# Patient Record
Sex: Female | Born: 1940 | Race: Black or African American | Hispanic: No
Health system: Southern US, Community
[De-identification: ages and names within clinical notes are randomized; demographics above are authoritative.]

## PROBLEM LIST (undated history)

## (undated) DIAGNOSIS — E7800 Pure hypercholesterolemia, unspecified: Secondary | ICD-10-CM

## (undated) DIAGNOSIS — I10 Essential (primary) hypertension: Secondary | ICD-10-CM

## (undated) HISTORY — PX: PARTIAL HYSTERECTOMY: SHX80

---

## 2015-06-04 ENCOUNTER — Emergency Department (HOSPITAL_COMMUNITY)
Admission: EM | Admit: 2015-06-04 | Discharge: 2015-06-04 | Disposition: A | Discharge status: Home / Self Care | Payer: Medicare HMO | Attending: Emergency Medicine | Admitting: Emergency Medicine

## 2015-06-04 ENCOUNTER — Emergency Department (HOSPITAL_COMMUNITY): Payer: Medicare HMO

## 2015-06-04 ENCOUNTER — Encounter (HOSPITAL_COMMUNITY): Payer: Self-pay | Admitting: Emergency Medicine

## 2015-06-04 DIAGNOSIS — I10 Essential (primary) hypertension: Secondary | ICD-10-CM | POA: Diagnosis not present

## 2015-06-04 DIAGNOSIS — M25562 Pain in left knee: Secondary | ICD-10-CM | POA: Diagnosis not present

## 2015-06-04 DIAGNOSIS — Y999 Unspecified external cause status: Secondary | ICD-10-CM | POA: Diagnosis not present

## 2015-06-04 DIAGNOSIS — Y939 Activity, unspecified: Secondary | ICD-10-CM | POA: Diagnosis not present

## 2015-06-04 DIAGNOSIS — M25561 Pain in right knee: Secondary | ICD-10-CM | POA: Diagnosis not present

## 2015-06-04 DIAGNOSIS — Y9222 Religious institution as the place of occurrence of the external cause: Secondary | ICD-10-CM | POA: Insufficient documentation

## 2015-06-04 DIAGNOSIS — M25571 Pain in right ankle and joints of right foot: Secondary | ICD-10-CM | POA: Diagnosis not present

## 2015-06-04 DIAGNOSIS — W010XXA Fall on same level from slipping, tripping and stumbling without subsequent striking against object, initial encounter: Secondary | ICD-10-CM | POA: Insufficient documentation

## 2015-06-04 DIAGNOSIS — M79601 Pain in right arm: Secondary | ICD-10-CM

## 2015-06-04 DIAGNOSIS — M25531 Pain in right wrist: Secondary | ICD-10-CM | POA: Insufficient documentation

## 2015-06-04 DIAGNOSIS — Z79899 Other long term (current) drug therapy: Secondary | ICD-10-CM | POA: Insufficient documentation

## 2015-06-04 HISTORY — DX: Essential (primary) hypertension: I10

## 2015-06-04 HISTORY — DX: Pure hypercholesterolemia, unspecified: E78.00

## 2015-06-04 MED ORDER — ACETAMINOPHEN 325 MG PO TABS
650.0000 mg | ORAL_TABLET | Freq: Once | ORAL | Status: AC
  Administered 2015-06-04: 650 mg via ORAL
  Filled 2015-06-04: qty 2

## 2015-06-04 NOTE — Discharge Instructions (Signed)
You were seen in the emergency room today for evaluation after a fall. Your x-rays showed a possible fracture in your right elbow. To be on the safe side we put you in a splint and gave you a shoulder sling. When you return home please have your primary care provider refer you to an orthopedic specialist for follow up. In the meantime you may take Tylenol as needed for pain. The rest of your x-rays showed evidence of arthritis but no other abnormal findings. Return to the emergency room for new or worsening symptoms.

## 2015-06-04 NOTE — ED Notes (Addendum)
Tripped over a rug at a convention today. Fell onto concrete. Having general right arm pain, no obvious deformities, able to move all extremities in triage. Says she fell directly onto right side.

## 2015-06-04 NOTE — ED Provider Notes (Signed)
CSN: [id number]     Arrival date & time 06/04/15  1106 History   First MD Initiated Contact with Patient 06/04/15 1120     Chief Complaint  Patient presents with  . Hand Injury    HPI  Alisha King is an 75 y.o. female with history of HTN, HLD who presents to the ED for evaluation after a mechanical fall. She states she is visiting the area from [place] and was at [employer] just prior to arrival and tripped over a rug, falling onto her right side. She states her entire right arm and both knees are in pain now. She has not taken anything to alleviate her pain. She denies hitting her head or LOC. Denies dizziness, headache, blurry vision, n/v. Denies chest pain or SOB. She is not on any blood thinners.  Past Medical History  Diagnosis Date  . Hypertension   . High cholesterol    Past Surgical History  Procedure Laterality Date  . Partial hysterectomy     History reviewed. No pertinent family history. Social History  Substance Use Topics  . Smoking status: None  . Smokeless tobacco: None  . Alcohol Use: None   OB History    No data available     Review of Systems  All other systems reviewed and are negative.     Allergies  Review of patient's allergies indicates no known allergies.  Home Medications   Prior to Admission medications   Medication Sig Start Date End Date Taking? Authorizing Provider  atorvastatin (LIPITOR) 20 MG tablet Take 20 mg by mouth daily.   Yes Historical Provider, MD  Multiple Vitamin (MULTIVITAMIN WITH MINERALS) TABS tablet Take 1 tablet by mouth daily.   Yes Historical Provider, MD  Omega-3 Fatty Acids (FISH OIL PO) Take 4 capsules by mouth daily.   Yes Historical Provider, MD  triamterene-hydrochlorothiazide (MAXZIDE-25) 37.5-25 MG tablet Take 1 tablet by mouth daily.   Yes Historical Provider, MD  zinc gluconate 50 MG tablet Take 50 mg by mouth daily.   Yes Historical Provider, MD   BP 168/77 mmHg  Pulse 78  Temp(Src) 98.6 F (37  C) (Oral)  Resp 18  SpO2 97% Physical Exam  Constitutional: She is oriented to person, place, and time.  HENT:  Head: Atraumatic.  Right Ear: External ear normal.  Left Ear: External ear normal.  Nose: Nose normal.  Mouth/Throat: Oropharynx is clear and moist. No oropharyngeal exudate.  No facial tenderness or crepitus. No deformity. No scalp edema or laceration  Eyes: Conjunctivae and EOM are normal. Pupils are equal, round, and reactive to light.  Neck: Normal range of motion. Neck supple.  No c-spine tenderness  Cardiovascular: Normal rate, regular rhythm, normal heart sounds and intact distal pulses.   Pulmonary/Chest: Effort normal and breath sounds normal. No respiratory distress. She has no wheezes. She exhibits no tenderness.  Abdominal: Soft. Bowel sounds are normal. She exhibits no distension. There is no tenderness. There is no rebound and no guarding.  Musculoskeletal: She exhibits no edema.  No t-spine or l-spine tenderness. No stepoff or deformity. No right or left arm or elbow tenderness.  +R wrist tenderness but no deformity, edema, or discoloration. FROM of wrist. No hand tenderness. No snuffbox tenderness. FROM of fingers, normal finger thumb opposition. 2+ radial pulses, brisk cap refill Bilateral knees nontender. No edema. FROM.  +R ankle tenderness at medial AND lateral malleoli. FROM. No deformity or discoloration Ambulatory with steady gait  Neurological: She is alert and  oriented to person, place, and time. No cranial nerve deficit.  Normal finger to nose No pronator drift  Skin: Skin is warm and dry.  Psychiatric: She has a normal mood and affect.  Nursing note and vitals reviewed.   ED Course  Procedures (including critical care time) Labs Review Labs Reviewed - No data to display  Imaging Review Dg Lumbar Spine Complete  06/04/2015  CLINICAL DATA:  Acute low back pain after fall at church today. EXAM: LUMBAR SPINE - COMPLETE 4+ VIEW COMPARISON:   None. FINDINGS: No fracture or spondylolisthesis is noted. Mild degenerative disc disease is noted at L1-2, L2-3 and L3-4 with anterior osteophyte formation. Atherosclerosis of abdominal aorta is noted. Degenerative changes seen involving posterior facet joints of L4-5 and L5-S1. IMPRESSION: Multilevel degenerative disc disease. No acute abnormality seen in the lumbar spine. Electronically Signed   By: Lupita Raider, M.D.   On: 06/04/2015 12:42   Dg Shoulder Right  06/04/2015  CLINICAL DATA:  Pain following fall EXAM: RIGHT SHOULDER - 2+ VIEW COMPARISON:  None. FINDINGS: Frontal, Y scapular, and axillary images obtained. There is no acute fracture or dislocation. There is moderate generalized osteoarthritic change. There is moderate bony overgrowth along the inferior right distal clavicle. No erosive change. Visualized right lung is clear. IMPRESSION: Moderate generalized osteoarthritic change. Note that there is bony overgrowth along the inferior lateral right clavicle. No fracture or dislocation. Bony overgrowth in the region of the lateral right clavicle potentially may lead to so-called impingement syndrome. MR is the imaging study of choice to evaluate for that entity. Electronically Signed   By: Bretta Bang III M.D.   On: 06/04/2015 12:38   Dg Elbow Complete Right  06/04/2015  CLINICAL DATA:  Injury. EXAM: RIGHT ELBOW - COMPLETE 3+ VIEW COMPARISON:  No recent prior. FINDINGS: Prominent degenerative changes noted about the right elbow. Small lucency noted along the coronoid process. Subtle nondisplaced fracture cannot be excluded. This may be related to degenerative changes and may be old. No other focal abnormality. No evidence of significant effusion. IMPRESSION: 1. Prominent degenerative changes right elbow. 2. Tiny bony density noted along the tip of the coronoid process. Although this may be old and related to degenerative change. A subtle nondisplaced fracture of the coronoid process cannot be  excluded. Electronically Signed   By: Maisie Fus  Register   On: 06/04/2015 12:37   Dg Wrist Complete Right  06/04/2015  CLINICAL DATA:  Acute right wrist pain after fall. EXAM: RIGHT WRIST - COMPLETE 3+ VIEW COMPARISON:  None. FINDINGS: There is no evidence of fracture or dislocation. Narrowing and osteophyte formation of first carpometacarpal joint is noted. Soft tissues are unremarkable. IMPRESSION: Mild osteoarthritis of first carpometacarpal joint. No acute abnormality seen in the right wrist. Electronically Signed   By: Lupita Raider, M.D.   On: 06/04/2015 12:37   Dg Ankle Complete Right  06/04/2015  CLINICAL DATA:  Pain following fall EXAM: RIGHT ANKLE - COMPLETE 3+ VIEW COMPARISON:  None. FINDINGS: Frontal, oblique, and lateral views were obtained. There is soft tissue swelling. There is no demonstrable fracture or joint effusion. The ankle mortise appears intact. There is osteoarthritic change in the ankle joint, primarily medially. There is spurring in the dorsal midfoot. There are spurs arising from the posterior and inferior calcaneus. IMPRESSION: Areas of arthropathy. Soft tissue swelling. No demonstrable fracture. The ankle mortise appears intact. Electronically Signed   By: Bretta Bang III M.D.   On: 06/04/2015 12:40   Dg  Knee Complete 4 Views Left  06/04/2015  CLINICAL DATA:  Acute left knee pain after fall today at church. Initial encounter. EXAM: LEFT KNEE - COMPLETE 4+ VIEW COMPARISON:  None. FINDINGS: No evidence of fracture, dislocation, or joint effusion. Mild narrowing of medial joint space is noted, with osteophyte formation seen medially and laterally. Moderate narrowing of patellofemoral space is noted as well. Vascular calcifications are noted. IMPRESSION: Moderate degenerative joint disease is noted. No acute abnormality seen in the left knee. Electronically Signed   By: Lupita RaiderJames  Green Jr, M.D.   On: 06/04/2015 12:40   Dg Knee Complete 4 Views Right  06/04/2015  CLINICAL DATA:   Injury. EXAM: RIGHT KNEE - COMPLETE 4+ VIEW COMPARISON:  No prior. FINDINGS: Infrapatellar soft tissue swelling is noted. 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 Diffuse osteopenia. No evidence of fracture. Peripheral vascular calcification. IMPRESSION: 1. Infrapatellar soft tissue swelling.  No evidence of fracture. 2. 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 3. Peripheral vascular disease. Electronically Signed   By: Maisie Fushomas  Register   On: 06/04/2015 12:38   I have personally reviewed and evaluated these images and lab results as part of my medical decision-making.   EKG Interpretation None      MDM   Final diagnoses:  Right arm pain  Fall due to stumbling, initial encounter    Originally had planned for x-ray of right wrist and ankle based on exam findings. However, pt requesting x-ray of her entire R upper extremity, lumbar spine, both knees, and right ankle. 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 Will order x-rays as requested. Tylenol ordered for pain. Pt has ice pack in room.   X-rays reveal possible nondisplaced fracture of coronoid process. Otherwise X-rays reveal diffuse degenerative changes and no other acute abnormalities. She is neurovascularly intact. Pt seen and evaluated by attending MD Cyndie ChimeNguyen. Will place in long arm splint and give shoulder sling. Pt will f/u with PCP when she returns home to [place] and obtain referral to ortho. In the meantime RICE therapy and tylenol for pain. ER return precautions given.  Carlene CoriaSerena Y Seaton Hofmann, PA-C 06/04/15 1336   Prior to discharge pt now declining posterior splint. She states all the clothes she has with her will not fit over a splint. We will wrap in an ACE wrap instead. Follow up as above.   Carlene CoriaSerena Y Juliene Kirsh, PA-C 06/04/15 1348  Leta BaptistEmily Roe Nguyen, MD 06/06/15 [phone]

## 2017-11-06 IMAGING — CR DG ANKLE COMPLETE 3+V*R*
3 series · 3 of 3 positions shown · non-contrast
Comparison: None.

CLINICAL DATA: Pain following fall

EXAM:
RIGHT ANKLE - COMPLETE 3+ VIEW

[x ankle ap right]
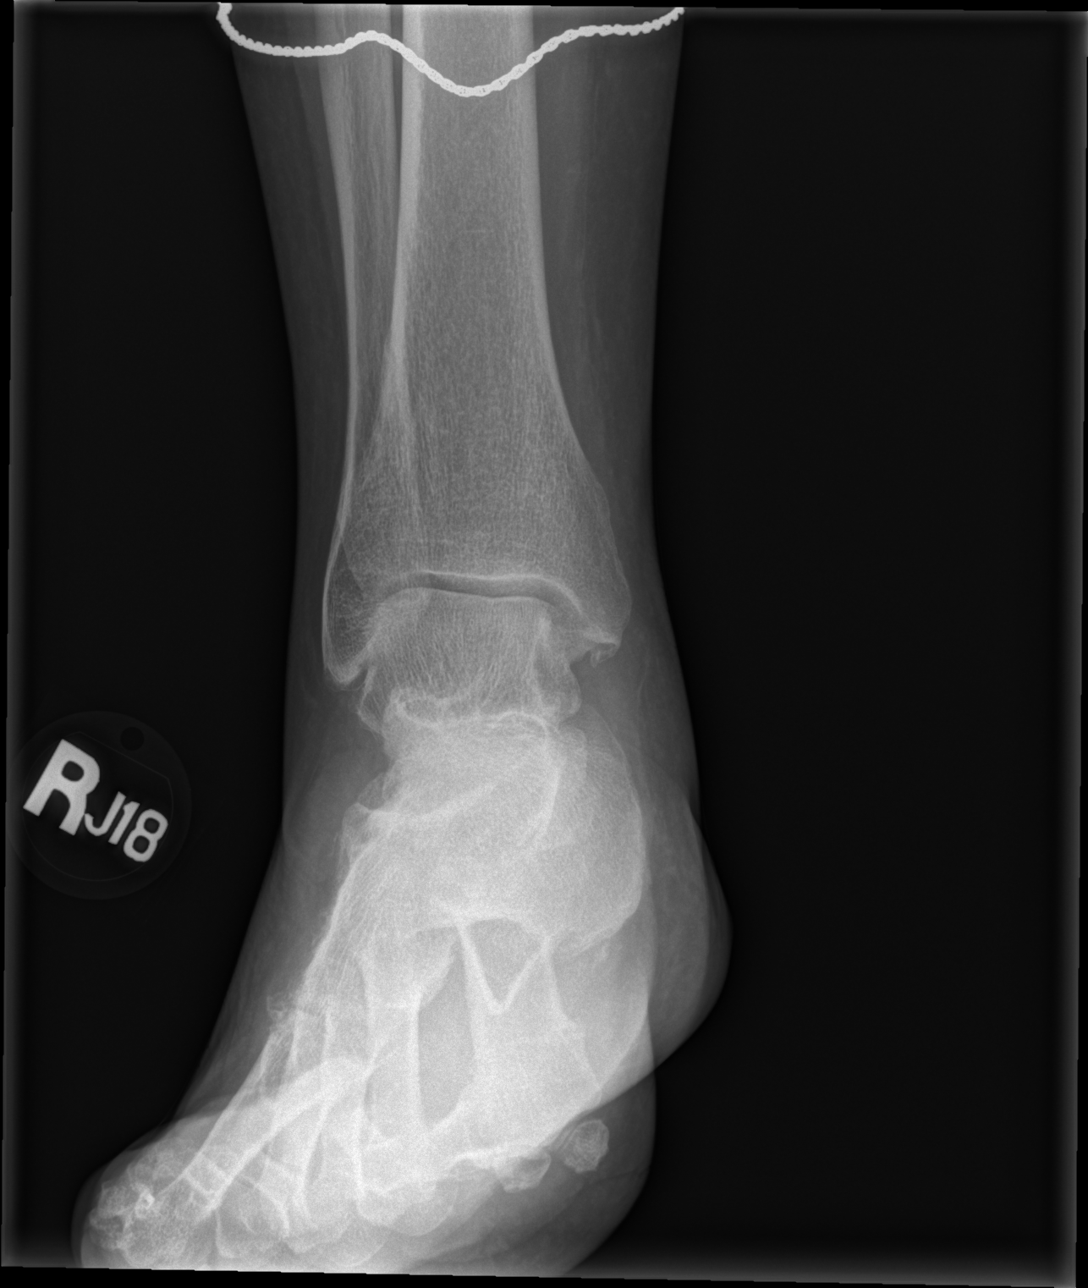

[x ankle obl right]
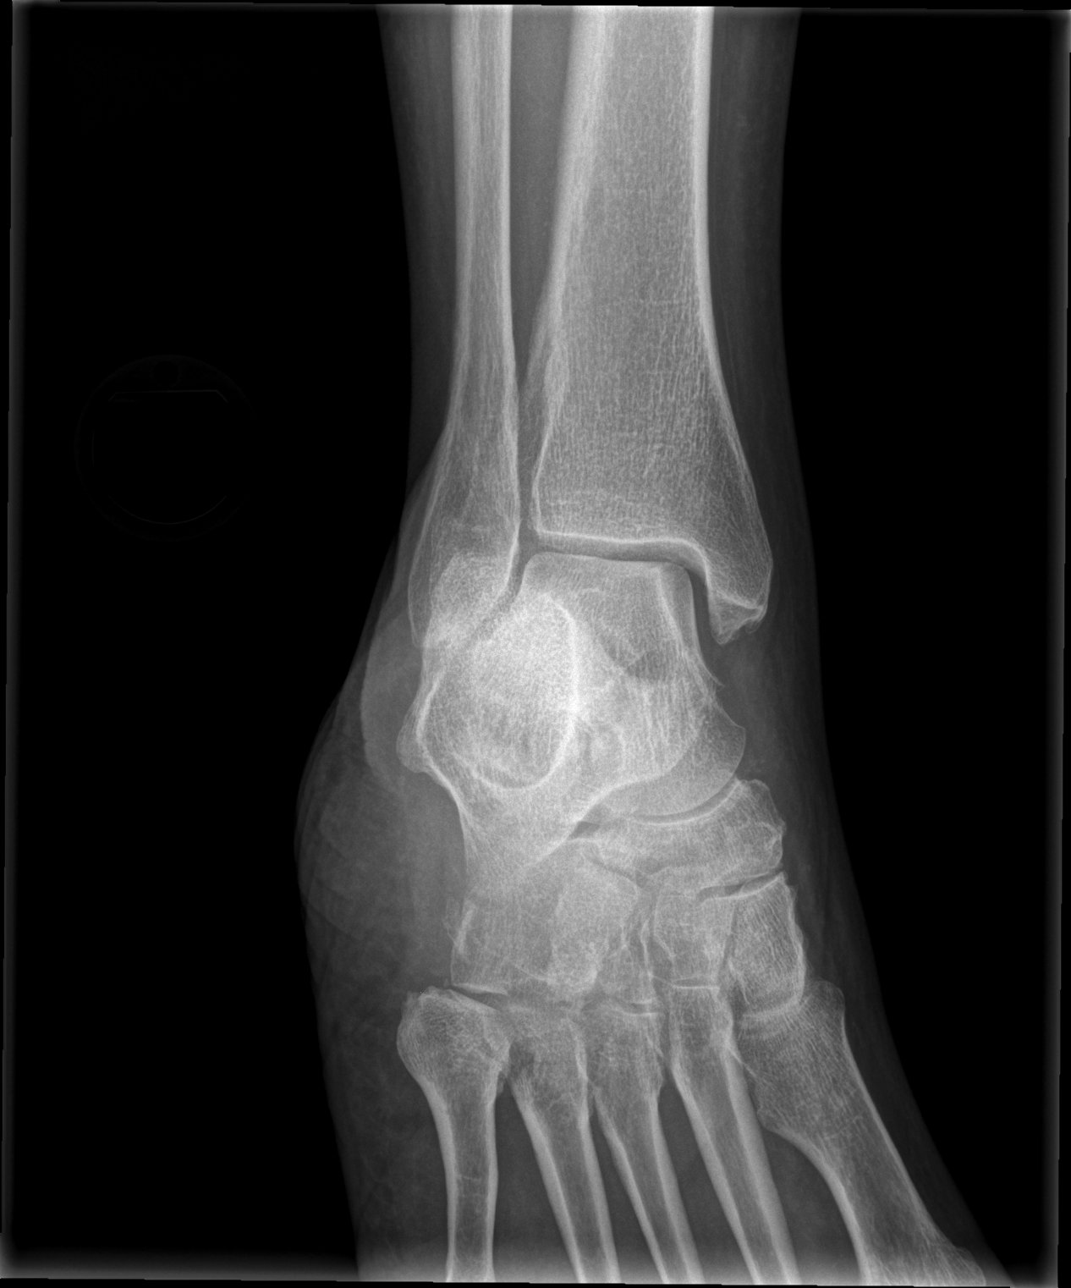

[x ankle lat right]
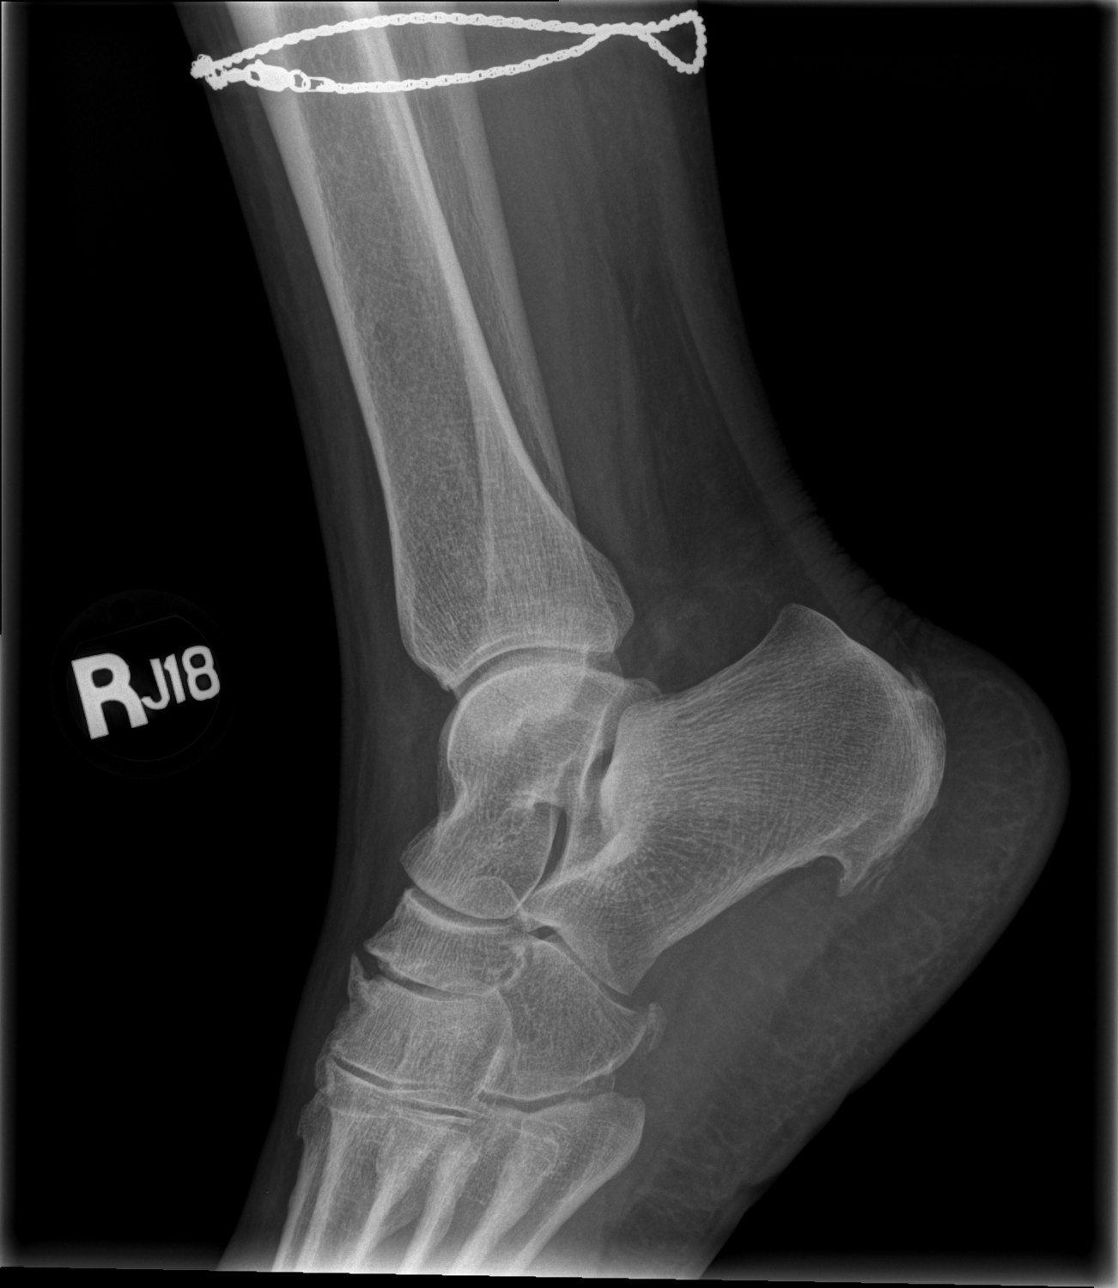

[3 of 3 positions shown; findings below may reference images not displayed]

FINDINGS: Frontal, oblique, and lateral views were obtained. There is soft
tissue swelling. There is no demonstrable fracture or joint
effusion. The ankle mortise appears intact. There is osteoarthritic
change in the ankle joint, primarily medially. There is spurring in
the dorsal midfoot. There are spurs arising from the posterior and
inferior calcaneus.
IMPRESSION: Areas of arthropathy. Soft tissue swelling. No demonstrable
fracture. The ankle mortise appears intact.

## 2017-11-06 IMAGING — CR DG KNEE COMPLETE 4+V*R*
4 series · 4 of 4 positions shown · non-contrast
Comparison: No prior.

CLINICAL DATA: Injury.

EXAM:
RIGHT KNEE - COMPLETE 4+ VIEW

[t knee ap right]
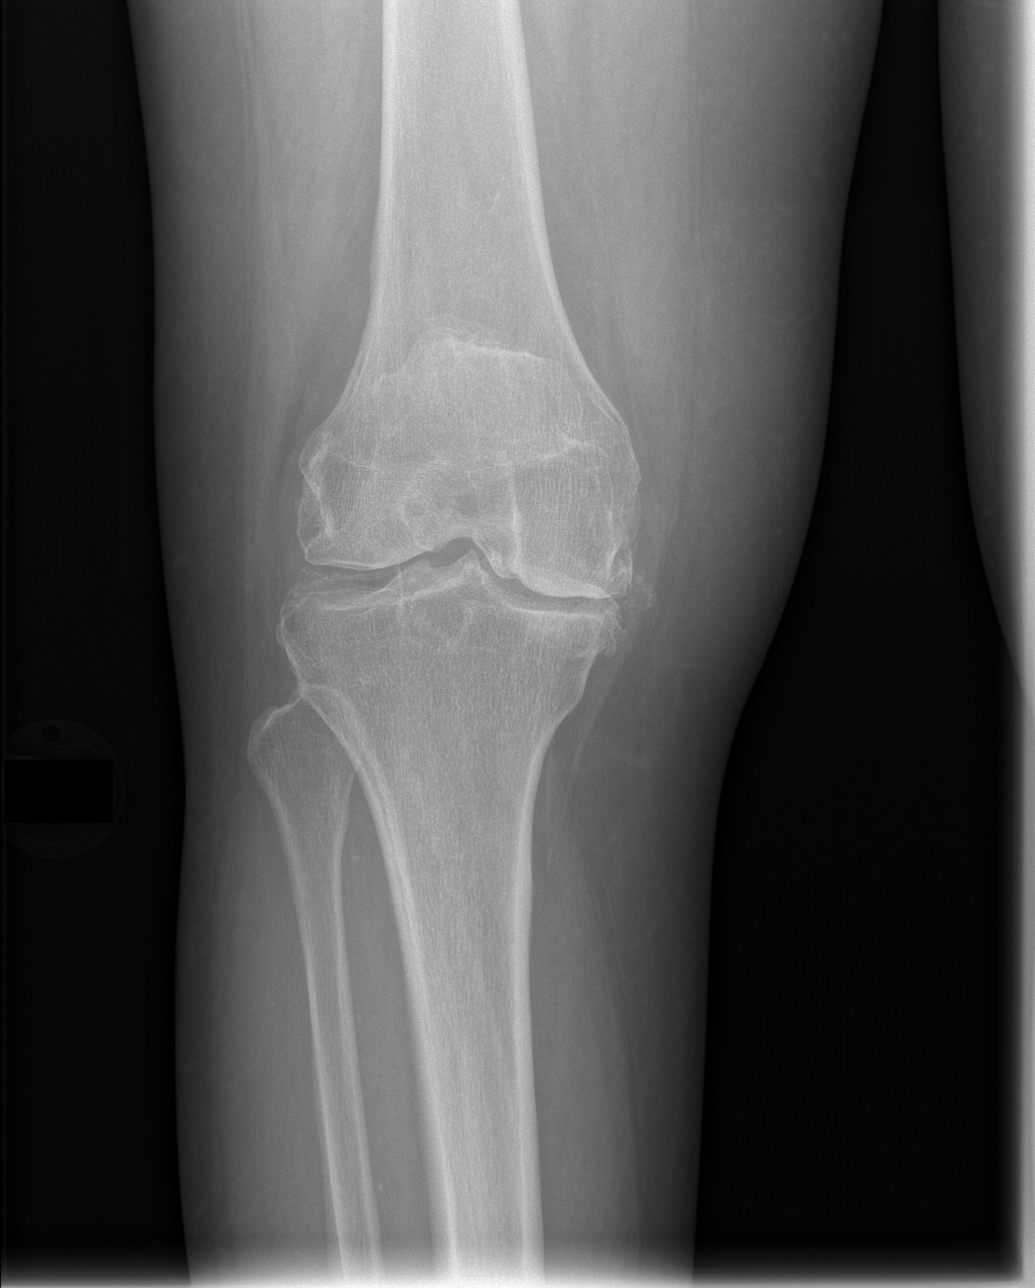

[t knee obl right (1 of 2)]
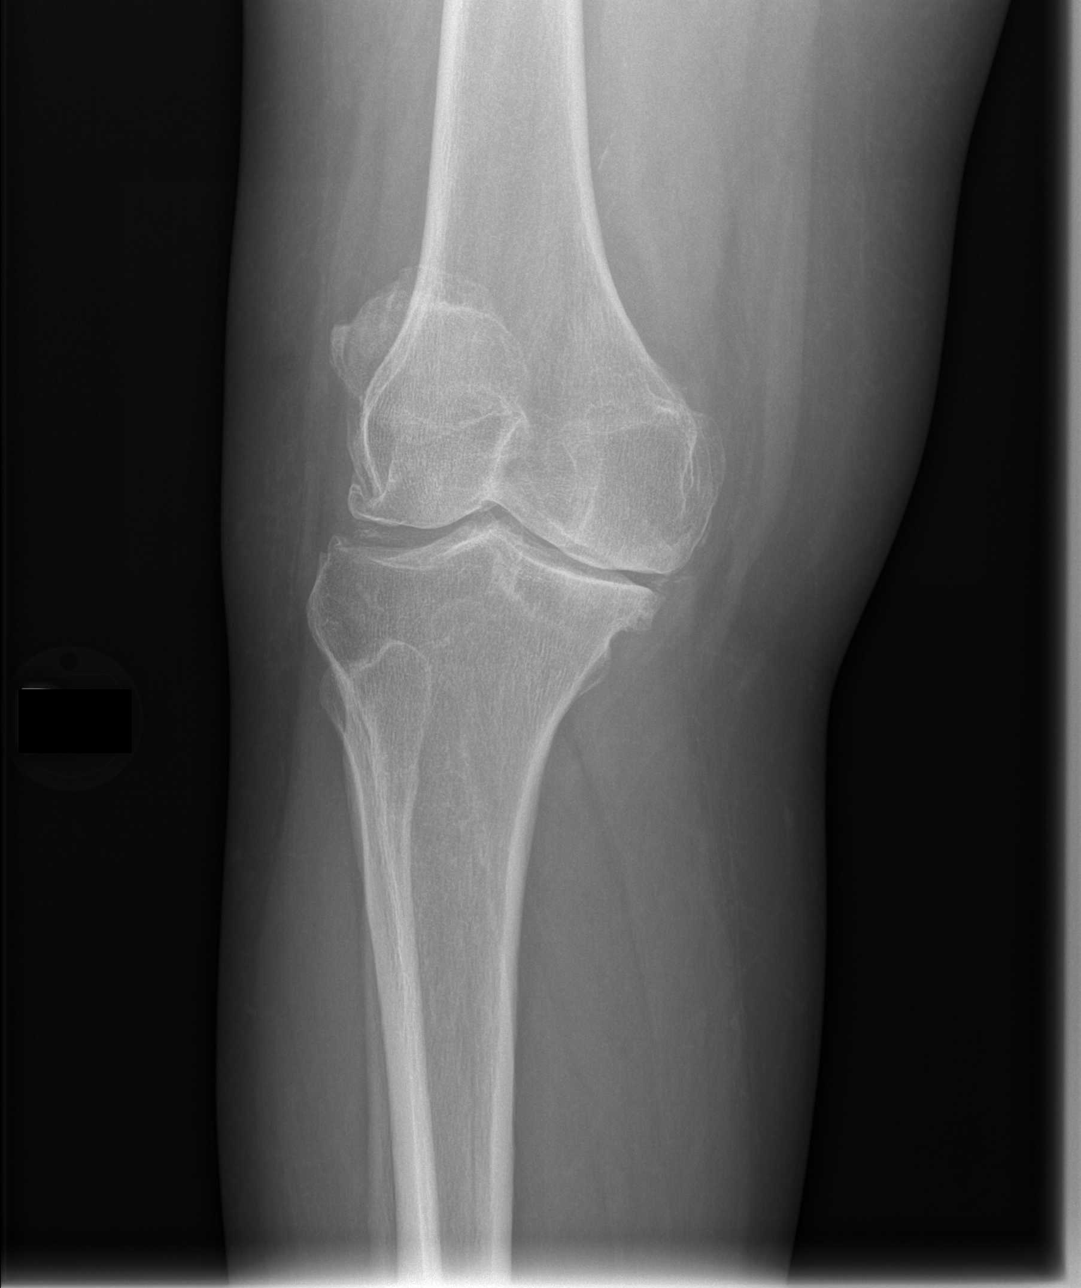

[t knee obl right (2 of 2)]
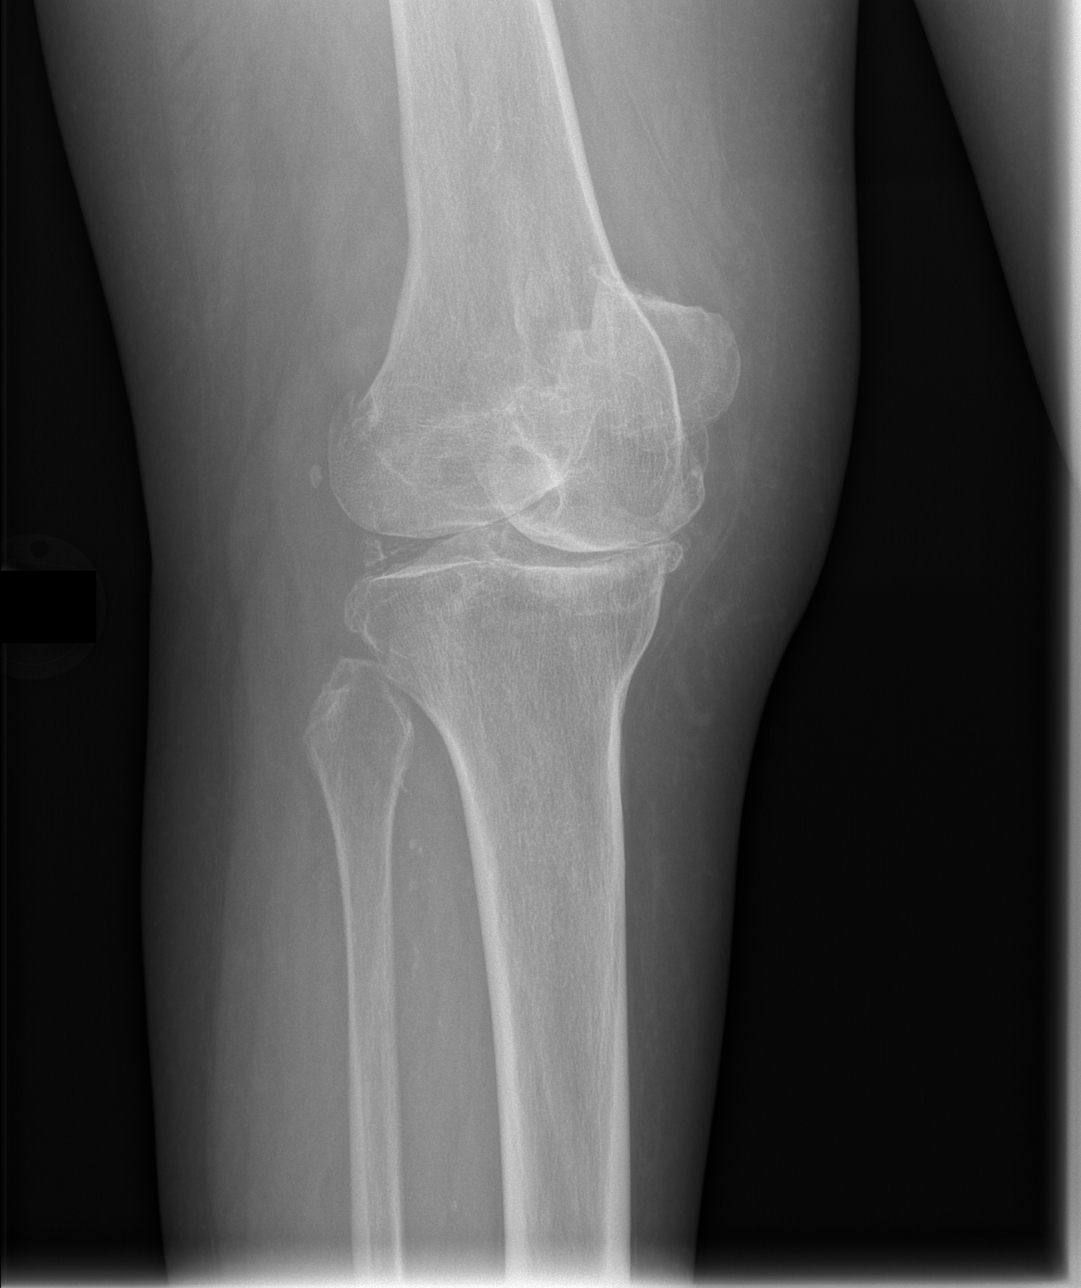

[t knee lat right]
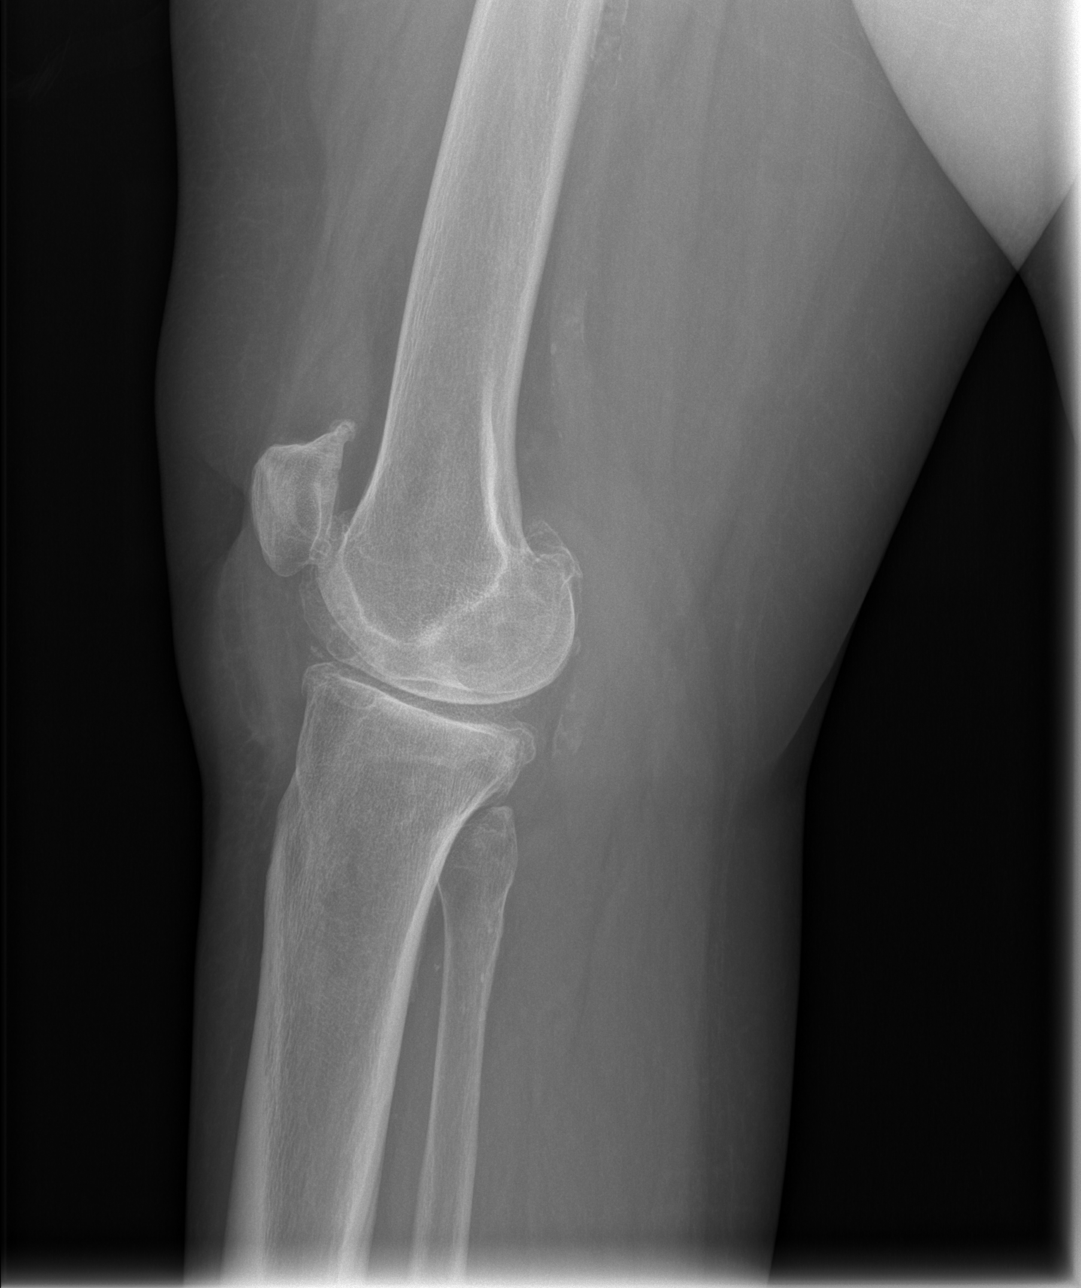

[4 of 4 positions shown; findings below may reference images not displayed]

FINDINGS: Infrapatellar soft tissue swelling is noted. Severe tricompartment
degenerative change with chondrocalcinosis noted. Diffuse
osteopenia. No evidence of fracture. Peripheral vascular
calcification.
IMPRESSION: 1. Infrapatellar soft tissue swelling.  No evidence of fracture.

2. Severe tricompartment degenerative change with chondrocalcinosis.

3. Peripheral vascular disease.

## 2017-11-06 IMAGING — CR DG SHOULDER 2+V*R*
3 series · 3 of 3 positions shown · non-contrast
Comparison: None.

CLINICAL DATA: Pain following fall

EXAM:
RIGHT SHOULDER - 2+ VIEW

[t shoulder internal right (1 of 2)]
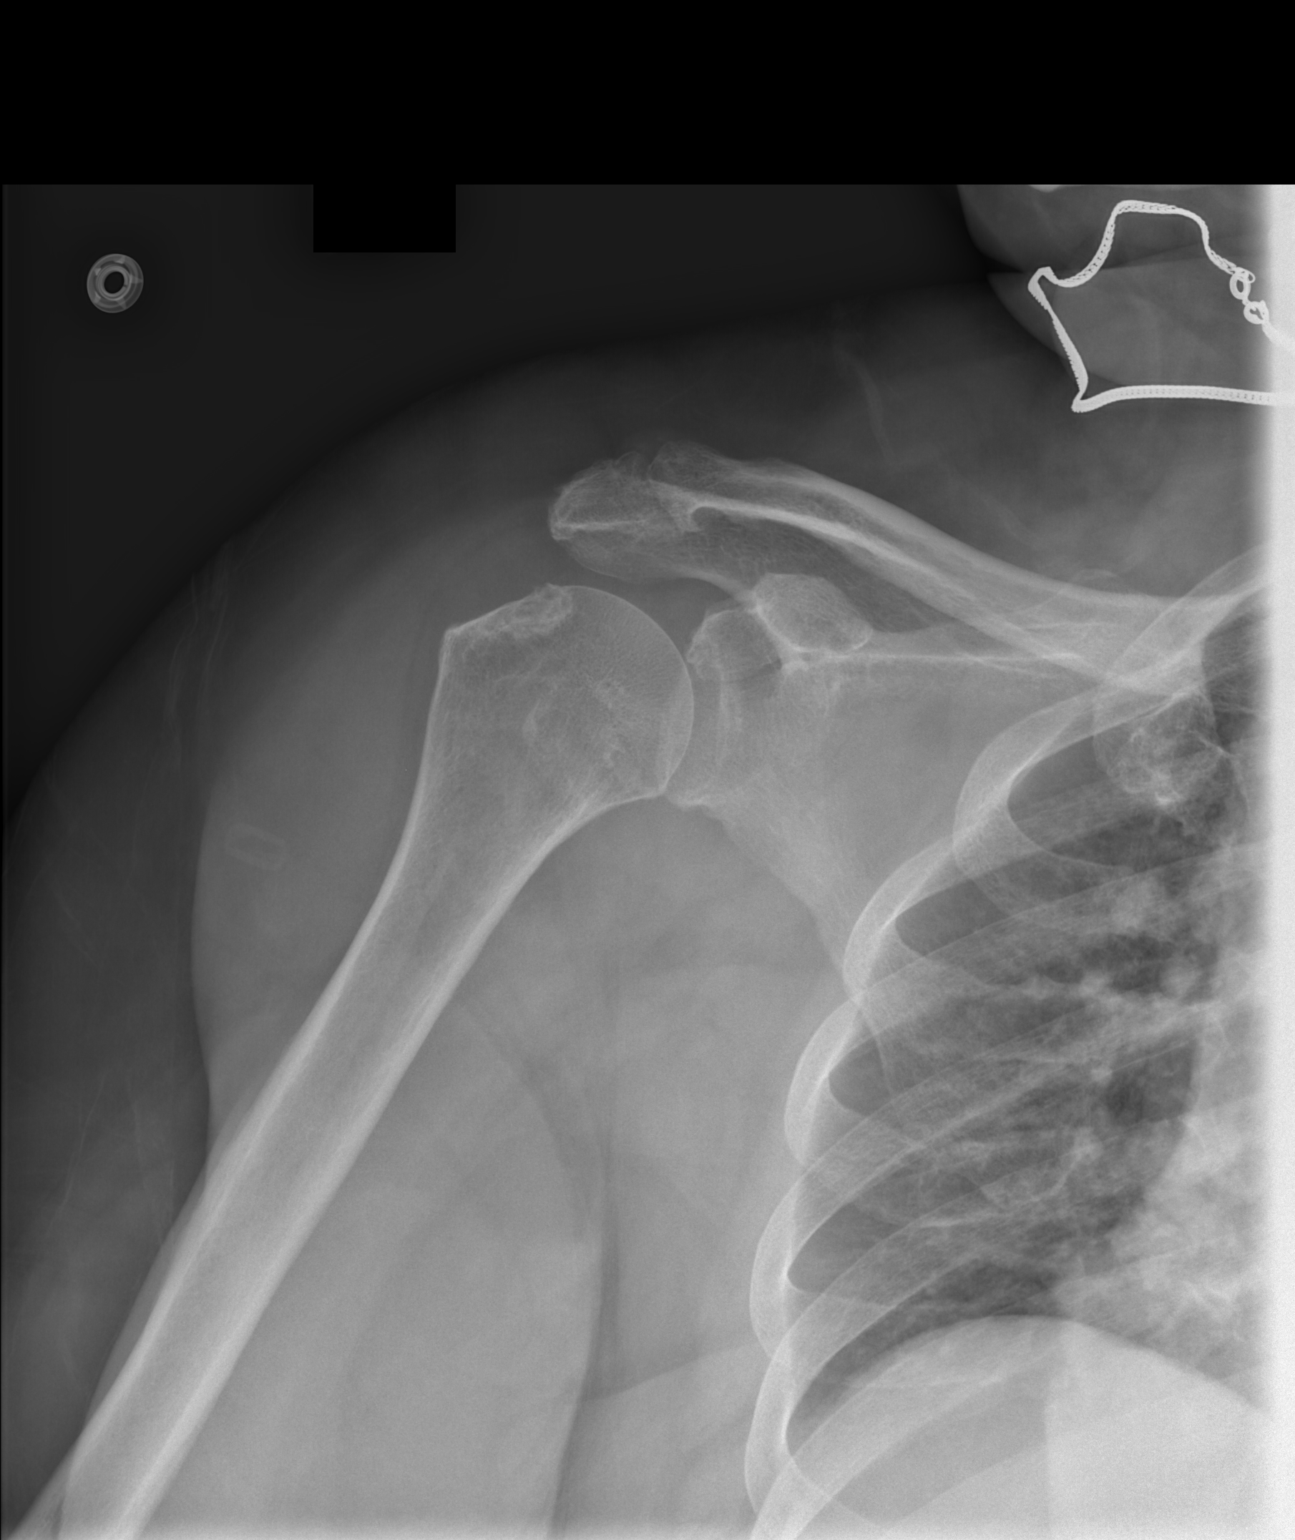

[t shoulder internal right (2 of 2)]
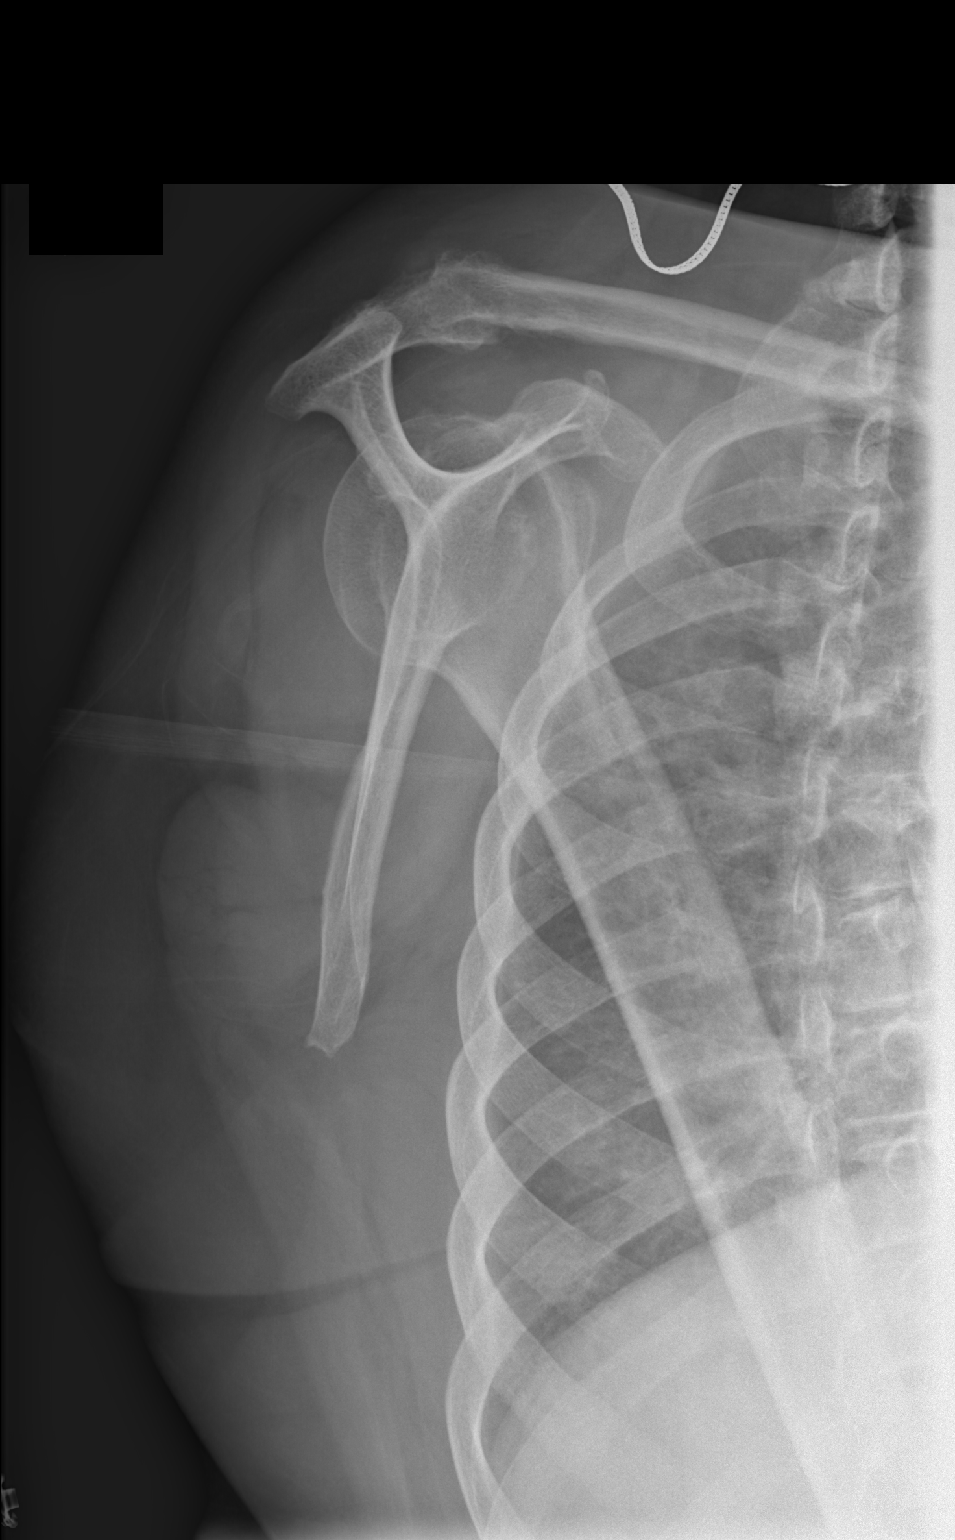

[x shoulder axillary right]
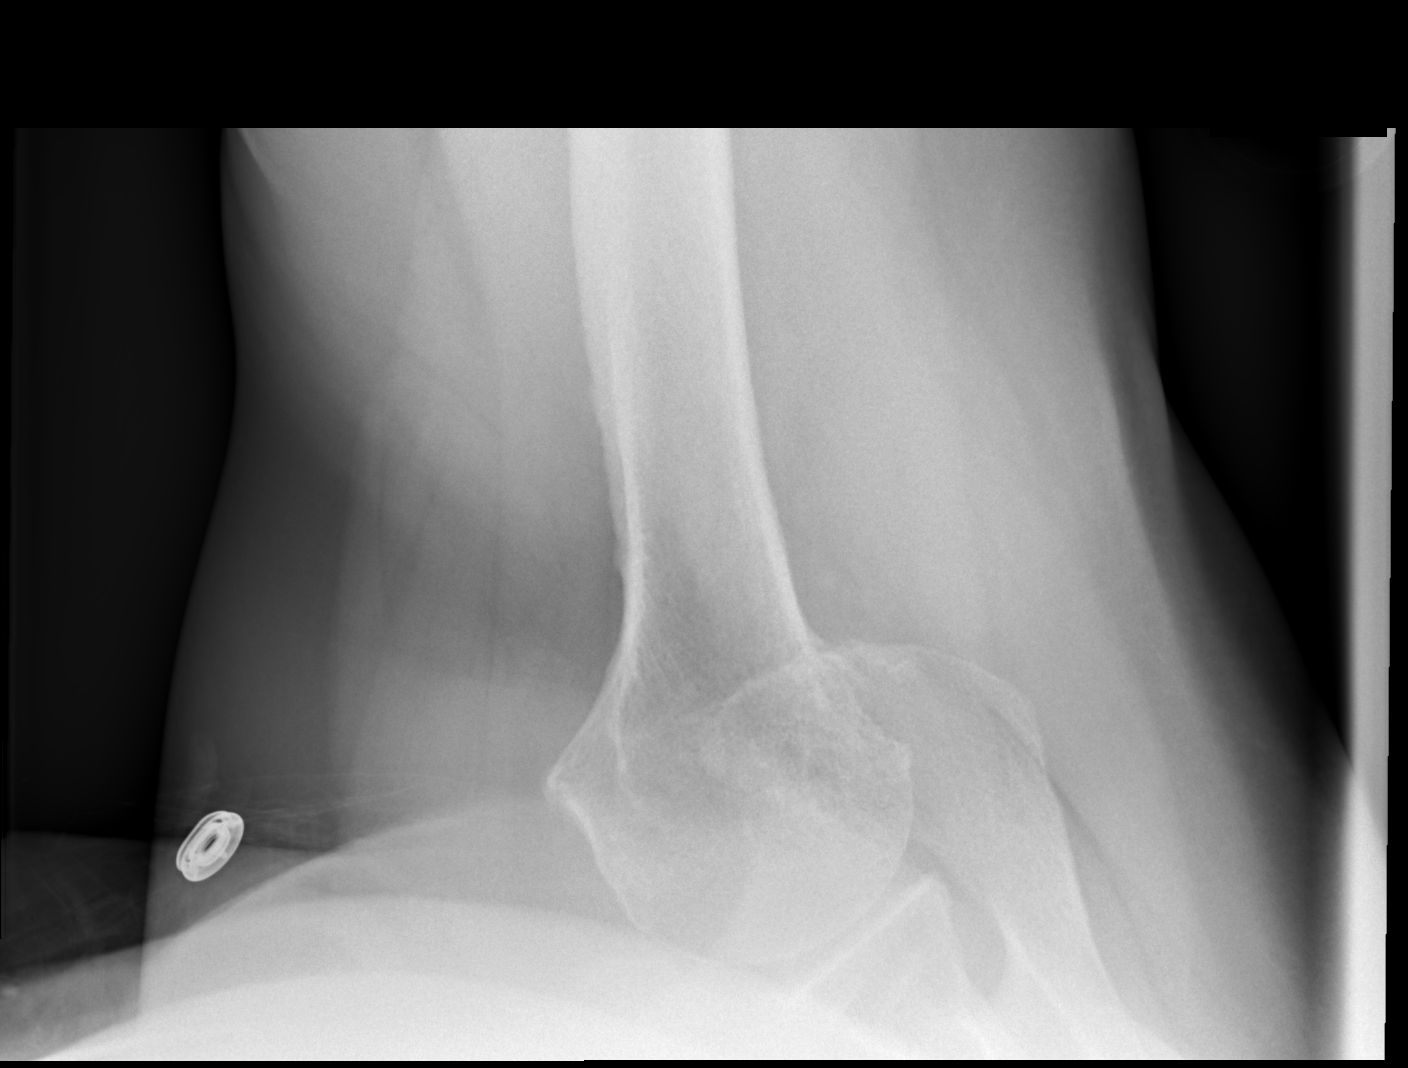

[3 of 3 positions shown; findings below may reference images not displayed]

FINDINGS: Frontal, Y scapular, and axillary images obtained. There is no acute
fracture or dislocation. There is moderate generalized
osteoarthritic change. There is moderate bony overgrowth along the
inferior right distal clavicle. No erosive change. Visualized right
lung is clear.
IMPRESSION: Moderate generalized osteoarthritic change. Note that there is bony
overgrowth along the inferior lateral right clavicle. No fracture or
dislocation.

Bony overgrowth in the region of the lateral right clavicle
potentially may lead to so-called impingement syndrome. MR is the
imaging study of choice to evaluate for that entity.
